# Patient Record
Sex: Male | Born: 1960 | Race: White | Hispanic: No | Marital: Married
Health system: Southern US, Community
[De-identification: ages and names within clinical notes are randomized; demographics above are authoritative.]

## PROBLEM LIST (undated history)

## (undated) DIAGNOSIS — E119 Type 2 diabetes mellitus without complications: Secondary | ICD-10-CM

---

## 2018-01-03 ENCOUNTER — Emergency Department (HOSPITAL_COMMUNITY)
Admission: EM | Admit: 2018-01-03 | Discharge: 2018-01-04 | Disposition: A | Discharge status: Home / Self Care | Payer: PRIVATE HEALTH INSURANCE | Attending: Emergency Medicine | Admitting: Emergency Medicine

## 2018-01-03 ENCOUNTER — Other Ambulatory Visit: Payer: Self-pay

## 2018-01-03 ENCOUNTER — Encounter (HOSPITAL_COMMUNITY): Payer: Self-pay | Admitting: Emergency Medicine

## 2018-01-03 DIAGNOSIS — R109 Unspecified abdominal pain: Secondary | ICD-10-CM | POA: Diagnosis not present

## 2018-01-03 DIAGNOSIS — E119 Type 2 diabetes mellitus without complications: Secondary | ICD-10-CM | POA: Diagnosis not present

## 2018-01-03 DIAGNOSIS — R112 Nausea with vomiting, unspecified: Secondary | ICD-10-CM | POA: Diagnosis not present

## 2018-01-03 DIAGNOSIS — Z794 Long term (current) use of insulin: Secondary | ICD-10-CM | POA: Insufficient documentation

## 2018-01-03 DIAGNOSIS — Z79899 Other long term (current) drug therapy: Secondary | ICD-10-CM | POA: Insufficient documentation

## 2018-01-03 DIAGNOSIS — K529 Noninfective gastroenteritis and colitis, unspecified: Secondary | ICD-10-CM | POA: Diagnosis not present

## 2018-01-03 DIAGNOSIS — R1110 Vomiting, unspecified: Secondary | ICD-10-CM | POA: Diagnosis present

## 2018-01-03 HISTORY — DX: Type 2 diabetes mellitus without complications: E11.9

## 2018-01-03 LAB — CBC
HEMATOCRIT: 52.5 % — AB (ref 39.0–52.0)
HEMOGLOBIN: 17.2 g/dL — AB (ref 13.0–17.0)
MCH: 30 pg (ref 26.0–34.0)
MCHC: 32.8 g/dL (ref 30.0–36.0)
MCV: 91.6 fL (ref 78.0–100.0)
Platelets: 290 10*3/uL (ref 150–400)
RBC: 5.73 MIL/uL (ref 4.22–5.81)
RDW: 12.4 % (ref 11.5–15.5)
WBC: 13.3 10*3/uL — AB (ref 4.0–10.5)

## 2018-01-03 LAB — CBG MONITORING, ED: GLUCOSE-CAPILLARY: 141 mg/dL — AB (ref 65–99)

## 2018-01-03 LAB — URINALYSIS, ROUTINE W REFLEX MICROSCOPIC
BACTERIA UA: NONE SEEN
BILIRUBIN URINE: NEGATIVE
Glucose, UA: >=500 mg/dL — AB
Hgb urine dipstick: NEGATIVE
KETONES UR: 80 mg/dL — AB
LEUKOCYTES UA: NEGATIVE
NITRITE: NEGATIVE
PROTEIN: NEGATIVE mg/dL
Specific Gravity, Urine: 1.036 — ABNORMAL HIGH (ref 1.005–1.030)
pH: 6 (ref 5.0–8.0)

## 2018-01-03 LAB — COMPREHENSIVE METABOLIC PANEL
ALBUMIN: 4.4 g/dL (ref 3.5–5.0)
ALT: 18 U/L (ref 17–63)
ANION GAP: 15 (ref 5–15)
AST: 16 U/L (ref 15–41)
Alkaline Phosphatase: 89 U/L (ref 38–126)
BILIRUBIN TOTAL: 1.2 mg/dL (ref 0.3–1.2)
BUN: 28 mg/dL — AB (ref 6–20)
CHLORIDE: 109 mmol/L (ref 101–111)
CO2: 17 mmol/L — ABNORMAL LOW (ref 22–32)
Calcium: 9.6 mg/dL (ref 8.9–10.3)
Creatinine, Ser: 1.01 mg/dL (ref 0.61–1.24)
GFR calc Af Amer: >60 mL/min (ref >60)
Glucose, Bld: 136 mg/dL — ABNORMAL HIGH (ref 65–99)
POTASSIUM: 3.8 mmol/L (ref 3.5–5.1)
Sodium: 141 mmol/L (ref 135–145)
TOTAL PROTEIN: 7.5 g/dL (ref 6.5–8.1)

## 2018-01-03 LAB — LIPASE, BLOOD: LIPASE: 34 U/L (ref 11–51)

## 2018-01-03 MED ORDER — ONDANSETRON 4 MG PO TBDP
4.0000 mg | ORAL_TABLET | Freq: Once | ORAL | Status: AC | PRN
  Administered 2018-01-03: 4 mg via ORAL
  Filled 2018-01-03: qty 1

## 2018-01-03 NOTE — ED Triage Notes (Signed)
Pt reports he is in town for a ball tournament, after breakfast he began to feel nauseated. Pt states he has vomited around 5 times today.  Only medical issue is Type 2 diabetes.  CBG is 141.

## 2018-01-04 ENCOUNTER — Encounter (HOSPITAL_COMMUNITY): Payer: Self-pay | Admitting: Radiology

## 2018-01-04 ENCOUNTER — Emergency Department (HOSPITAL_COMMUNITY): Payer: PRIVATE HEALTH INSURANCE

## 2018-01-04 LAB — CBC WITH DIFFERENTIAL/PLATELET
ABS IMMATURE GRANULOCYTES: 0 10*3/uL (ref 0.0–0.1)
Basophils Absolute: 0 10*3/uL (ref 0.0–0.1)
Basophils Relative: 0 %
Eosinophils Absolute: 0 10*3/uL (ref 0.0–0.7)
Eosinophils Relative: 0 %
HCT: 48.1 % (ref 39.0–52.0)
HEMOGLOBIN: 15.9 g/dL (ref 13.0–17.0)
IMMATURE GRANULOCYTES: 0 %
Lymphocytes Relative: 21 %
Lymphs Abs: 2.1 10*3/uL (ref 0.7–4.0)
MCH: 30.4 pg (ref 26.0–34.0)
MCHC: 33.1 g/dL (ref 30.0–36.0)
MCV: 92 fL (ref 78.0–100.0)
MONO ABS: 0.6 10*3/uL (ref 0.1–1.0)
MONOS PCT: 6 %
NEUTROS ABS: 7.5 10*3/uL (ref 1.7–7.7)
NEUTROS PCT: 73 %
PLATELETS: 282 10*3/uL (ref 150–400)
RBC: 5.23 MIL/uL (ref 4.22–5.81)
RDW: 12.6 % (ref 11.5–15.5)
WBC: 10.4 10*3/uL (ref 4.0–10.5)

## 2018-01-04 LAB — I-STAT CG4 LACTIC ACID, ED: Lactic Acid, Venous: 1.16 mmol/L (ref 0.5–1.9)

## 2018-01-04 MED ORDER — METRONIDAZOLE 500 MG PO TABS: 500.0000 mg | ORAL_TABLET | Freq: Three times a day (TID) | ORAL | 0 refills | Status: AC

## 2018-01-04 MED ORDER — METRONIDAZOLE 500 MG PO TABS
500.0000 mg | ORAL_TABLET | Freq: Once | ORAL | Status: AC
  Administered 2018-01-04: 500 mg via ORAL
  Filled 2018-01-04: qty 1

## 2018-01-04 MED ORDER — CIPROFLOXACIN HCL 500 MG PO TABS: 500.0000 mg | ORAL_TABLET | Freq: Two times a day (BID) | ORAL | 0 refills | Status: DC

## 2018-01-04 MED ORDER — IOHEXOL 300 MG/ML  SOLN
100.0000 mL | Freq: Once | INTRAMUSCULAR | Status: AC | PRN
  Administered 2018-01-04: 100 mL via INTRAVENOUS

## 2018-01-04 MED ORDER — METRONIDAZOLE 500 MG PO TABS: 500.0000 mg | ORAL_TABLET | Freq: Three times a day (TID) | ORAL | 0 refills | Status: DC

## 2018-01-04 MED ORDER — METOCLOPRAMIDE HCL 10 MG PO TABS: 10.0000 mg | ORAL_TABLET | Freq: Four times a day (QID) | ORAL | 0 refills | Status: AC | PRN

## 2018-01-04 MED ORDER — DIPHENHYDRAMINE HCL 50 MG/ML IJ SOLN
25.0000 mg | Freq: Once | INTRAMUSCULAR | Status: AC
  Administered 2018-01-04: 25 mg via INTRAVENOUS
  Filled 2018-01-04: qty 1

## 2018-01-04 MED ORDER — SODIUM CHLORIDE 0.9 % IV BOLUS
1000.0000 mL | Freq: Once | INTRAVENOUS | Status: AC
  Administered 2018-01-04: 1000 mL via INTRAVENOUS

## 2018-01-04 MED ORDER — ONDANSETRON HCL 4 MG/2ML IJ SOLN
4.0000 mg | Freq: Once | INTRAMUSCULAR | Status: AC
  Administered 2018-01-04: 4 mg via INTRAVENOUS
  Filled 2018-01-04: qty 2

## 2018-01-04 MED ORDER — CIPROFLOXACIN HCL 500 MG PO TABS: 500.0000 mg | ORAL_TABLET | Freq: Two times a day (BID) | ORAL | 0 refills | Status: AC

## 2018-01-04 MED ORDER — METOCLOPRAMIDE HCL 10 MG PO TABS: 10.0000 mg | ORAL_TABLET | Freq: Four times a day (QID) | ORAL | 0 refills | Status: DC | PRN

## 2018-01-04 MED ORDER — IOHEXOL 300 MG/ML  SOLN: 100.0000 mL | Freq: Once | INTRAMUSCULAR | Status: DC | PRN

## 2018-01-04 MED ORDER — CIPROFLOXACIN HCL 500 MG PO TABS
500.0000 mg | ORAL_TABLET | Freq: Once | ORAL | Status: AC
  Administered 2018-01-04: 500 mg via ORAL
  Filled 2018-01-04: qty 1

## 2018-01-04 MED ORDER — METOCLOPRAMIDE HCL 5 MG/ML IJ SOLN
10.0000 mg | Freq: Once | INTRAMUSCULAR | Status: AC
  Administered 2018-01-04: 10 mg via INTRAVENOUS
  Filled 2018-01-04: qty 2

## 2018-01-04 NOTE — Discharge Instructions (Addendum)
Do not drink any alcohol while taking metronidazole. If you do, you will get very sick.  If symptoms are getting worse, return to the emergency department

## 2018-01-04 NOTE — ED Provider Notes (Signed)
[facility] EMERGENCY DEPARTMENT Provider Note   CSN: [id number] Arrival date & time: 01/03/18  2001     History   Chief Complaint Chief Complaint  Patient presents with  . Emesis    HPI Edward Orozco is a 57 y.o. male.   The history is provided by the patient.   He has history of diabetes, and comes in because of nausea, vomiting, periumbilical pain.  He had eaten breakfast this morning and felt fine.  However, mid morning, he started having nausea and vomiting and periumbilical pain.  Pain is rated at 6/10.  Nothing makes it better, nothing makes it worse.  It is not affected by vomiting.  He has vomited 5-6 times.  He has had a bowel movement, and that did not affect pain either.  He denies fever or chills but has had some sweats.  He denies any urinary difficulty.  Nobody else who ate the breakfast food has gotten sick.  Past Medical History:  Diagnosis Date  . Diabetes mellitus without complication (HCC)     There are no active problems to display for this patient.   History reviewed. No pertinent surgical history.      Home Medications    Prior to Admission medications   Medication Sig Start Date End Date Taking? Authorizing Provider  Coenzyme Q10 (COQ10 PO) Take 1 tablet by mouth daily.   Yes [provider]  diphenhydramine-acetaminophen (TYLENOL PM) 25-500 MG TABS tablet Take 0.5 tablets by mouth at bedtime.   Yes [provider]  JARDIANCE 25 MG TABS tablet Take 25 mg by mouth daily. 12/22/17  Yes [provider]  naproxen sodium (ALEVE) 220 MG tablet Take 440 mg by mouth daily.   Yes [provider]  omeprazole (PRILOSEC) 20 MG capsule Take 20 mg by mouth daily. 12/08/17  Yes [provider]  rosuvastatin (CRESTOR) 20 MG tablet Take 20 mg by mouth See admin instructions. On Monday and Wednesday 11/29/17  Yes [provider]  TRESIBA FLEXTOUCH 100 UNIT/ML SOPN FlexTouch Pen Inject 36 Units  into the skin daily. 12/22/17  Yes [provider]    Family History No family history on file.  Social History Social History   Tobacco Use  . Smoking status: Not on file  Substance Use Topics  . Alcohol use: Not on file  . Drug use: Not on file     Allergies   Metformin and related   Review of Systems Review of Systems  All other systems reviewed and are negative.    Physical Exam Updated Vital Signs BP (!) 147/84 (BP Location: Right Arm)   Pulse 66   Temp 97.9 F (36.6 C) (Oral)   Resp 15   Ht 6' (1.829 m)   Wt 83 kg (183 lb)   SpO2 100%   BMI 24.82 kg/m    Physical Exam  Nursing note and vitals reviewed.  57 year old male, resting comfortably and in no acute distress. Vital signs are significant for mildly elevated systolic blood pressure. Oxygen saturation is 100%, which is normal. Head is normocephalic and atraumatic. PERRLA, EOMI. Oropharynx is clear. Neck is nontender and supple without adenopathy or JVD. Back is nontender and there is no CVA tenderness. Lungs are clear without rales, wheezes, or rhonchi. Chest is nontender. Heart has regular rate and rhythm without murmur. Abdomen is soft, flat, with mild to moderate periumbilical tenderness.  There is no rebound or guarding.  There are no masses or  hepatosplenomegaly and peristalsis is hypoactive. Extremities have no cyanosis or edema, full range of motion is present. Skin is warm and dry without rash. Neurologic: Mental status is normal, cranial nerves are intact, there are no motor or sensory deficits.  ED Treatments / Results  Labs (all labs ordered are listed, but only abnormal results are displayed) Labs Reviewed  COMPREHENSIVE METABOLIC PANEL - Abnormal; Notable for the following components:      Result Value   CO2 17 (*)    Glucose, Bld 136 (*)    BUN 28 (*)    All other components within normal limits  CBC - Abnormal; Notable for the following components:   WBC 13.3 (*)     Hemoglobin 17.2 (*)    HCT 52.5 (*)    All other components within normal limits  URINALYSIS, ROUTINE W REFLEX MICROSCOPIC - Abnormal; Notable for the following components:   Specific Gravity, Urine 1.036 (*)    Glucose, UA >=500 (*)    Ketones, ur 80 (*)    All other components within normal limits  CBG MONITORING, ED - Abnormal; Notable for the following components:   Glucose-Capillary 141 (*)    All other components within normal limits  LIPASE, BLOOD  CBC WITH DIFFERENTIAL/PLATELET    Radiology Ct Abdomen Pelvis W Contrast  Result Date: 01/04/2018 CLINICAL DATA:  Acute onset of periumbilical abdominal pain and vomiting. EXAM: CT ABDOMEN AND PELVIS WITH CONTRAST TECHNIQUE: Multidetector CT imaging of the abdomen and pelvis was performed using the standard protocol following bolus administration of intravenous contrast. CONTRAST:  100mL OMNIPAQUE IOHEXOL 300 MG/ML  SOLN COMPARISON:  None. FINDINGS: Lower chest: Minimal bibasilar atelectasis is noted. The visualized portions of the mediastinum are unremarkable. Hepatobiliary: The liver is unremarkable in appearance. The gallbladder is unremarkable in appearance. The common bile duct remains normal in caliber. Pancreas: The pancreas is within normal limits. Spleen: The spleen is unremarkable in appearance. Adrenals/Urinary Tract: The adrenal glands are unremarkable in appearance. Bilateral renal cysts are noted. There is no evidence of hydronephrosis. No renal or ureteral stones are identified. No perinephric stranding is seen. Stomach/Bowel: The appendix is borderline normal in caliber, without evidence of appendicitis. Mild wall thickening is noted along the proximal ascending colon, which may reflect a mild infectious or inflammatory process. The small bowel is decompressed and grossly unremarkable. The stomach is partially filled with fluid. Vascular/Lymphatic: The abdominal aorta is unremarkable in appearance. The inferior vena cava is grossly  unremarkable. No retroperitoneal lymphadenopathy is seen. No pelvic sidewall lymphadenopathy is identified. Reproductive: The bladder is mildly distended and grossly unremarkable. The prostate remains normal in size. Other: No additional soft tissue abnormalities are seen. Musculoskeletal: No acute osseous abnormalities are identified. The visualized musculature is unremarkable in appearance. IMPRESSION: 1. Mild wall thickening along the proximal ascending colon may reflect a mild infectious or inflammatory process. 2. Bilateral renal cysts noted. Electronically Signed   By: Roanna RaiderJeffery  Chang M.D.   On: 01/04/2018 05:14    Procedures Procedures   Medications Ordered in ED Medications  sodium chloride 0.9 % bolus 1,000 mL (has no administration in time range)  ondansetron (ZOFRAN) injection 4 mg (has no administration in time range)  ondansetron (ZOFRAN-ODT) disintegrating tablet 4 mg (4 mg Oral Given 01/03/18 2025)     Initial Impression / Assessment and Plan / ED Course  I have reviewed the triage vital signs and the nursing notes.  Pertinent labs & imaging results that were available during my care of the  patient were reviewed by me and considered in my medical decision making (see chart for details).  Abdominal pain, nausea, vomiting.  Possible viral gastritis.  However, laboratory work-up does show leukocytosis and low CO2 level with normal anion gap.  Urinalysis has significant ketonuria as well as high specific gravity.  I am concerned for occult intra-abdominal process such as diverticulitis, appendicitis.  He will be sent for CT scan.  He will be given IV fluids, ondansetron.  He has no prior records in the [facility].  CT shows evidence of colitis involving the right colon.  Repeat CBC shows improvement in WBC, and also elevated hemoglobin has returned to normal suggesting dehydration has improved.  He continued to have nausea in spite of ondansetron, so he is given additional fluids  and dose of metoclopramide.  Following this, he is feeling much better.  He is discharged with prescription for metoclopramide as well as ciprofloxacin and metronidazole.  Initial dose of antibiotics given in the ED.  Patient is returning to his home in [place], return precautions discussed.  If outside this area, go to the nearest emergency department if symptoms are worsening.  Final Clinical Impressions(s) / ED Diagnoses   Final diagnoses:  Non-intractable vomiting with nausea, unspecified vomiting type  Abdominal pain, unspecified abdominal location  Colitis    ED Discharge Orders        Ordered    metoCLOPramide (REGLAN) 10 MG tablet  Every 6 hours PRN     01/04/18 0724    metroNIDAZOLE (FLAGYL) 500 MG tablet  3 times daily     01/04/18 0724    ciprofloxacin (CIPRO) 500 MG tablet  2 times daily     01/04/18 0724      Dione Booze, MD 01/04/18 [phone]

## 2019-01-18 IMAGING — CT CT ABD-PELV W/ CM
2 of 5 series · 15 of 46 positions shown, 17 images · IV contrast (omnipaque)
Comparison: None.

CLINICAL DATA: Acute onset of periumbilical abdominal pain and
vomiting.

EXAM:
CT ABDOMEN AND PELVIS WITH CONTRAST
TECHNIQUE: Multidetector CT imaging of the abdomen and pelvis was performed
using the standard protocol following bolus administration of
intravenous contrast.
CONTRAST:  100mL OMNIPAQUE IOHEXOL 300 MG/ML  SOLN

[Series 3: a/p w/ 5mm · axial · 0.75mm/px · z∈[+691,+1141]mm · 12 of 104 slices shown, 14 images]
[im 7/104  soft-tissue]
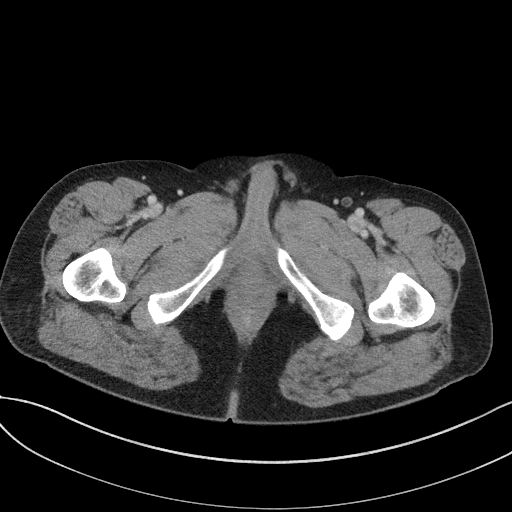
[im 7/104  bone]
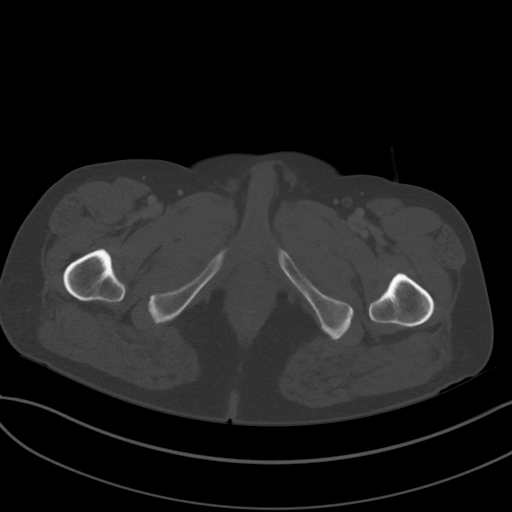
[im 19/104  soft-tissue]
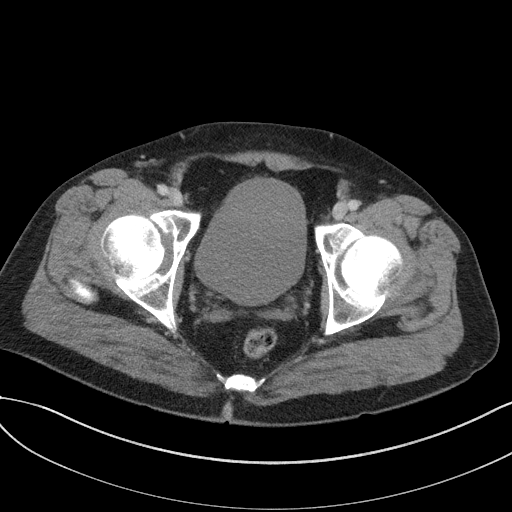
[im 25/104  soft-tissue]
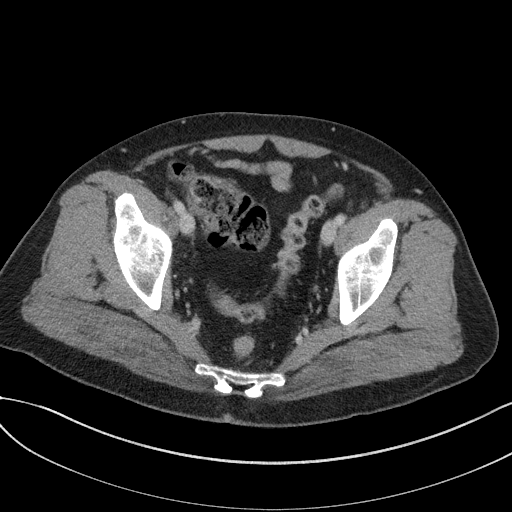
[im 31/104  soft-tissue]
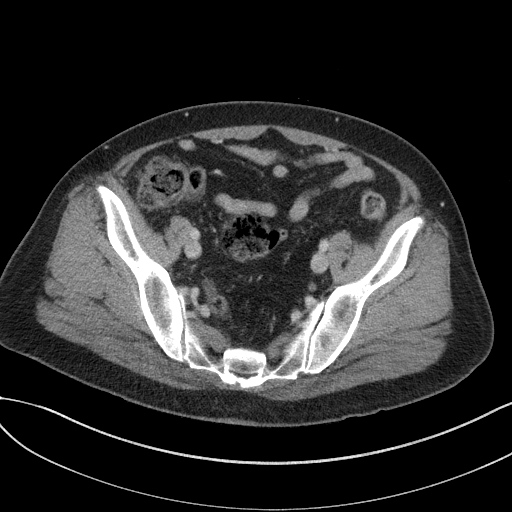
[im 43/104  soft-tissue]
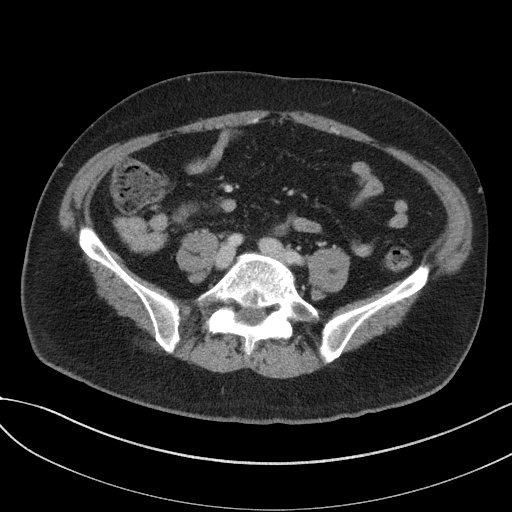
[im 49/104  soft-tissue]
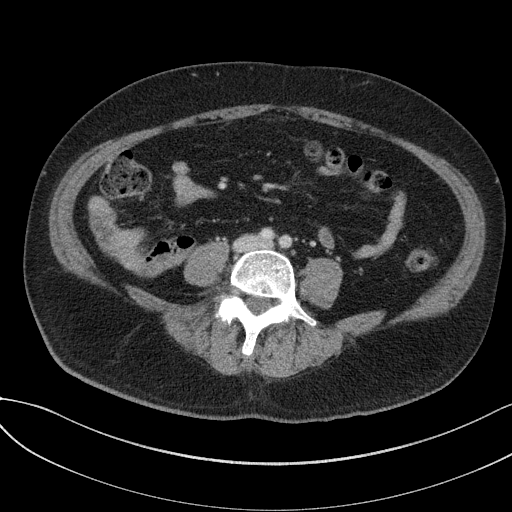
[im 55/104  soft-tissue]
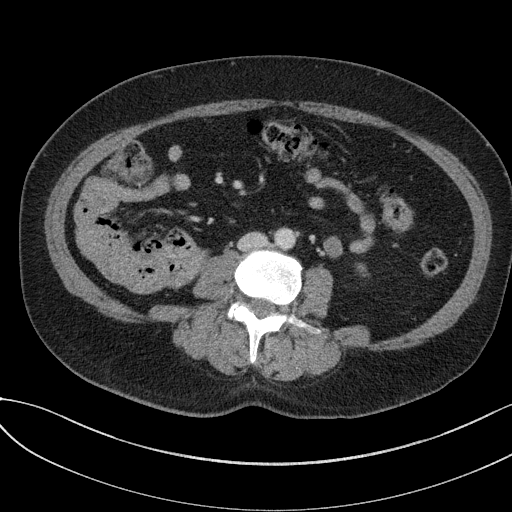
[im 67/104  soft-tissue]
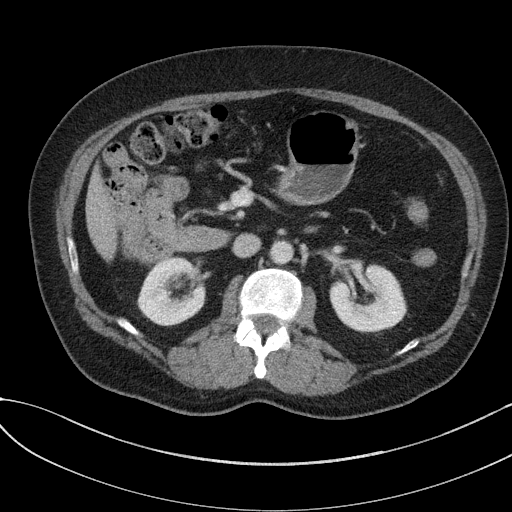
[im 73/104  soft-tissue]
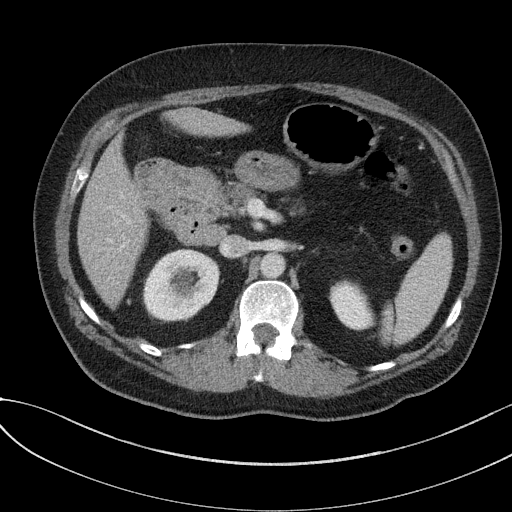
[im 73/104  bone]
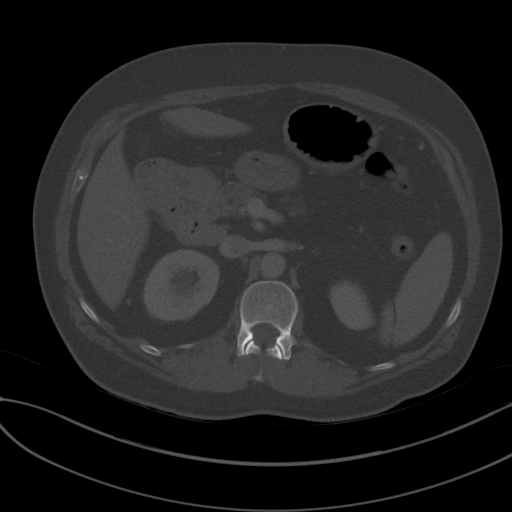
[im 79/104  soft-tissue]
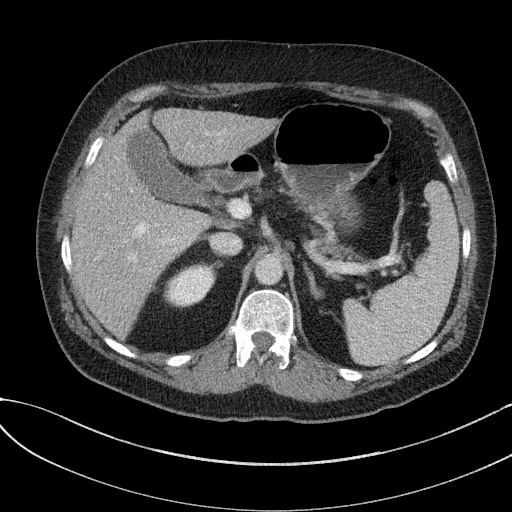
[im 91/104  soft-tissue]
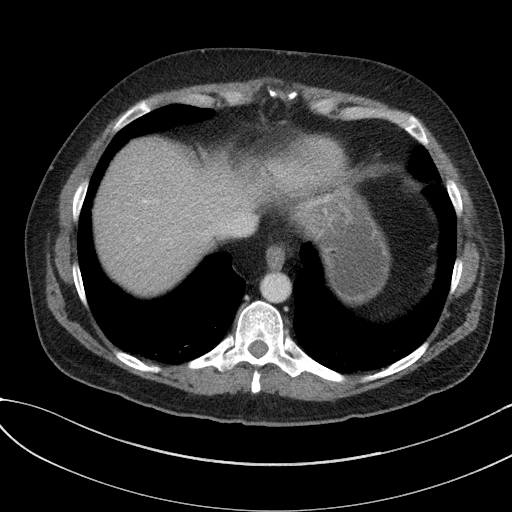
[im 97/104  soft-tissue]
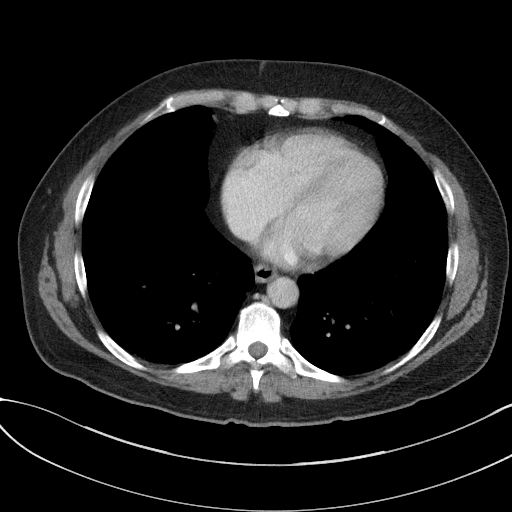

[Series 6: a/p w/ cor · coronal · 0.72mm/px · 3 of 138 slices shown]
[im 46/138  soft-tissue]
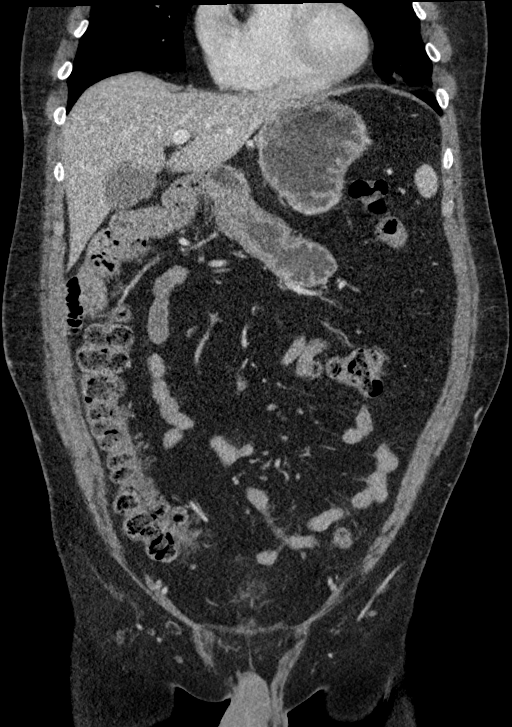
[im 61/138  soft-tissue]
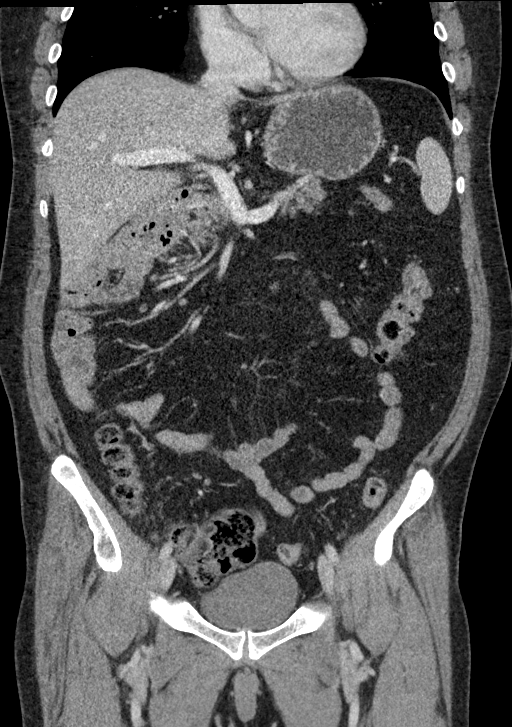
[im 77/138  soft-tissue]
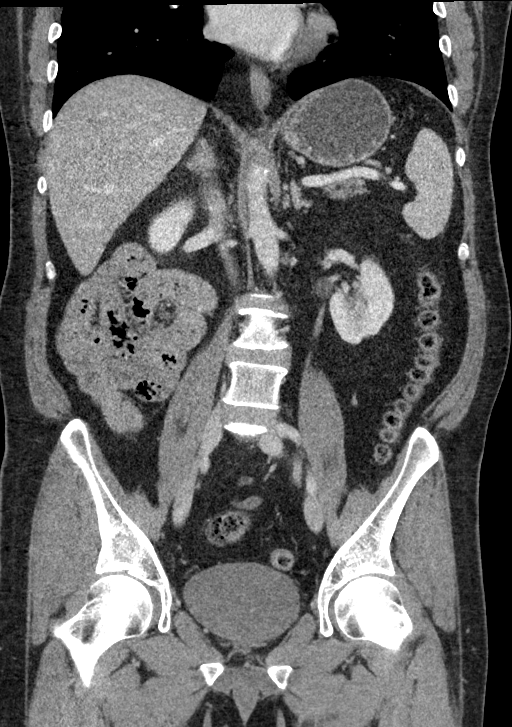

[15 of 46 positions shown; findings below may reference images not displayed]

FINDINGS: Lower chest: Minimal bibasilar atelectasis is noted. The visualized
portions of the mediastinum are unremarkable.

Hepatobiliary: The liver is unremarkable in appearance. The
gallbladder is unremarkable in appearance. The common bile duct
remains normal in caliber.

Pancreas: The pancreas is within normal limits.

Spleen: The spleen is unremarkable in appearance.

Adrenals/Urinary Tract: The adrenal glands are unremarkable in
appearance. Bilateral renal cysts are noted. There is no evidence of
hydronephrosis. No renal or ureteral stones are identified. No
perinephric stranding is seen.

Stomach/Bowel: The appendix is borderline normal in caliber, without
evidence of appendicitis. Mild wall thickening is noted along the
proximal ascending colon, which may reflect a mild infectious or
inflammatory process.

The small bowel is decompressed and grossly unremarkable. The
stomach is partially filled with fluid.

Vascular/Lymphatic: The abdominal aorta is unremarkable in
appearance. The inferior vena cava is grossly unremarkable. No
retroperitoneal lymphadenopathy is seen. No pelvic sidewall
lymphadenopathy is identified.

Reproductive: The bladder is mildly distended and grossly
unremarkable. The prostate remains normal in size.

Other: No additional soft tissue abnormalities are seen.

Musculoskeletal: No acute osseous abnormalities are identified. The
visualized musculature is unremarkable in appearance.
IMPRESSION: 1. Mild wall thickening along the proximal ascending colon may
reflect a mild infectious or inflammatory process.
2. Bilateral renal cysts noted.
